# Patient Record
Sex: Female | Born: 1955 | Race: White | Hispanic: No | Marital: Married | Smoking: Never smoker
Health system: Southern US, Community
[De-identification: ages and names within clinical notes are randomized; demographics above are authoritative.]

## PROBLEM LIST (undated history)

## (undated) DIAGNOSIS — N6310 Unspecified lump in the right breast, unspecified quadrant: Secondary | ICD-10-CM

## (undated) DIAGNOSIS — F419 Anxiety disorder, unspecified: Secondary | ICD-10-CM

## (undated) HISTORY — PX: WISDOM TOOTH EXTRACTION: SHX21

---

## 1997-12-20 ENCOUNTER — Other Ambulatory Visit: Admission: RE | Admit: 1997-12-20 | Discharge: 1997-12-20 | Payer: Self-pay | Admitting: Obstetrics & Gynecology

## 1998-03-21 ENCOUNTER — Other Ambulatory Visit: Admission: RE | Admit: 1998-03-21 | Discharge: 1998-03-21 | Payer: Self-pay | Admitting: Obstetrics & Gynecology

## 1998-05-07 ENCOUNTER — Other Ambulatory Visit: Admission: RE | Admit: 1998-05-07 | Discharge: 1998-05-07 | Payer: Self-pay | Admitting: Obstetrics & Gynecology

## 1998-11-18 ENCOUNTER — Other Ambulatory Visit: Admission: RE | Admit: 1998-11-18 | Discharge: 1998-11-18 | Payer: Self-pay | Admitting: Obstetrics & Gynecology

## 1999-03-24 ENCOUNTER — Other Ambulatory Visit: Admission: RE | Admit: 1999-03-24 | Discharge: 1999-03-24 | Payer: Self-pay | Admitting: Obstetrics & Gynecology

## 1999-11-24 ENCOUNTER — Other Ambulatory Visit: Admission: RE | Admit: 1999-11-24 | Discharge: 1999-11-24 | Payer: Self-pay | Admitting: Obstetrics & Gynecology

## 2000-02-16 ENCOUNTER — Other Ambulatory Visit: Admission: RE | Admit: 2000-02-16 | Discharge: 2000-02-16 | Payer: Self-pay | Admitting: Obstetrics & Gynecology

## 2000-02-16 ENCOUNTER — Encounter (INDEPENDENT_AMBULATORY_CARE_PROVIDER_SITE_OTHER): Payer: Self-pay | Admitting: Specialist

## 2000-05-03 ENCOUNTER — Other Ambulatory Visit: Admission: RE | Admit: 2000-05-03 | Discharge: 2000-05-03 | Payer: Self-pay | Admitting: Obstetrics & Gynecology

## 2002-03-16 ENCOUNTER — Other Ambulatory Visit: Admission: RE | Admit: 2002-03-16 | Discharge: 2002-03-16 | Payer: Self-pay | Admitting: Obstetrics & Gynecology

## 2003-01-11 ENCOUNTER — Other Ambulatory Visit: Admission: RE | Admit: 2003-01-11 | Discharge: 2003-01-11 | Payer: Self-pay | Admitting: Obstetrics & Gynecology

## 2003-04-03 ENCOUNTER — Other Ambulatory Visit: Admission: RE | Admit: 2003-04-03 | Discharge: 2003-04-03 | Payer: Self-pay | Admitting: Obstetrics & Gynecology

## 2005-11-05 ENCOUNTER — Encounter: Admission: RE | Admit: 2005-11-05 | Discharge: 2005-11-05 | Payer: Self-pay | Admitting: Obstetrics & Gynecology

## 2008-06-14 ENCOUNTER — Encounter: Admission: RE | Admit: 2008-06-14 | Discharge: 2008-06-14 | Payer: Self-pay | Admitting: Obstetrics & Gynecology

## 2009-11-07 ENCOUNTER — Encounter: Admission: RE | Admit: 2009-11-07 | Discharge: 2009-11-07 | Payer: Self-pay | Admitting: Obstetrics & Gynecology

## 2009-11-14 ENCOUNTER — Encounter: Admission: RE | Admit: 2009-11-14 | Discharge: 2009-11-14 | Payer: Self-pay | Admitting: Obstetrics & Gynecology

## 2009-11-17 HISTORY — PX: BREAST BIOPSY: SHX20

## 2010-06-21 ENCOUNTER — Encounter: Payer: Self-pay | Admitting: Obstetrics & Gynecology

## 2014-03-25 ENCOUNTER — Other Ambulatory Visit: Payer: Self-pay | Admitting: Obstetrics & Gynecology

## 2014-03-25 DIAGNOSIS — R928 Other abnormal and inconclusive findings on diagnostic imaging of breast: Secondary | ICD-10-CM

## 2014-04-05 ENCOUNTER — Ambulatory Visit
Admission: RE | Admit: 2014-04-05 | Discharge: 2014-04-05 | Disposition: A | Discharge status: Home / Self Care | Payer: No Typology Code available for payment source | Source: Ambulatory Visit | Attending: Obstetrics & Gynecology | Admitting: Obstetrics & Gynecology

## 2014-04-05 ENCOUNTER — Encounter (INDEPENDENT_AMBULATORY_CARE_PROVIDER_SITE_OTHER): Payer: Self-pay

## 2014-04-05 DIAGNOSIS — R928 Other abnormal and inconclusive findings on diagnostic imaging of breast: Secondary | ICD-10-CM

## 2015-04-16 ENCOUNTER — Other Ambulatory Visit: Payer: Self-pay | Admitting: Obstetrics & Gynecology

## 2015-04-16 DIAGNOSIS — R928 Other abnormal and inconclusive findings on diagnostic imaging of breast: Secondary | ICD-10-CM

## 2015-06-13 ENCOUNTER — Ambulatory Visit
Admission: RE | Admit: 2015-06-13 | Discharge: 2015-06-13 | Disposition: A | Discharge status: Home / Self Care | Payer: BLUE CROSS/BLUE SHIELD | Source: Ambulatory Visit | Attending: Obstetrics & Gynecology | Admitting: Obstetrics & Gynecology

## 2015-06-13 DIAGNOSIS — R928 Other abnormal and inconclusive findings on diagnostic imaging of breast: Secondary | ICD-10-CM

## 2016-02-06 ENCOUNTER — Other Ambulatory Visit: Payer: Self-pay | Admitting: Obstetrics & Gynecology

## 2016-02-06 DIAGNOSIS — N63 Unspecified lump in unspecified breast: Secondary | ICD-10-CM

## 2016-03-17 ENCOUNTER — Ambulatory Visit
Admission: RE | Admit: 2016-03-17 | Discharge: 2016-03-17 | Disposition: A | Discharge status: Home / Self Care | Payer: BLUE CROSS/BLUE SHIELD | Source: Ambulatory Visit | Attending: Obstetrics & Gynecology | Admitting: Obstetrics & Gynecology

## 2016-03-17 ENCOUNTER — Other Ambulatory Visit: Payer: Self-pay | Admitting: Obstetrics & Gynecology

## 2016-03-17 DIAGNOSIS — N63 Unspecified lump in unspecified breast: Secondary | ICD-10-CM

## 2016-05-25 ENCOUNTER — Ambulatory Visit
Admission: RE | Admit: 2016-05-25 | Discharge: 2016-05-25 | Disposition: A | Discharge status: Home / Self Care | Payer: BLUE CROSS/BLUE SHIELD | Source: Ambulatory Visit | Attending: Obstetrics & Gynecology | Admitting: Obstetrics & Gynecology

## 2016-12-10 ENCOUNTER — Other Ambulatory Visit: Payer: Self-pay | Admitting: Obstetrics & Gynecology

## 2016-12-10 DIAGNOSIS — N6001 Solitary cyst of right breast: Secondary | ICD-10-CM

## 2016-12-24 ENCOUNTER — Ambulatory Visit
Admission: RE | Admit: 2016-12-24 | Discharge: 2016-12-24 | Disposition: A | Discharge status: Home / Self Care | Payer: BLUE CROSS/BLUE SHIELD | Source: Ambulatory Visit | Attending: Obstetrics & Gynecology | Admitting: Obstetrics & Gynecology

## 2016-12-24 ENCOUNTER — Other Ambulatory Visit: Payer: Self-pay | Admitting: Obstetrics & Gynecology

## 2016-12-24 DIAGNOSIS — N6001 Solitary cyst of right breast: Secondary | ICD-10-CM

## 2016-12-24 DIAGNOSIS — N6489 Other specified disorders of breast: Secondary | ICD-10-CM

## 2017-01-07 ENCOUNTER — Other Ambulatory Visit: Payer: Self-pay | Admitting: Obstetrics & Gynecology

## 2017-01-07 ENCOUNTER — Ambulatory Visit
Admission: RE | Admit: 2017-01-07 | Discharge: 2017-01-07 | Disposition: A | Discharge status: Home / Self Care | Payer: BLUE CROSS/BLUE SHIELD | Source: Ambulatory Visit | Attending: Obstetrics & Gynecology | Admitting: Obstetrics & Gynecology

## 2017-01-07 DIAGNOSIS — N6489 Other specified disorders of breast: Secondary | ICD-10-CM

## 2017-02-18 ENCOUNTER — Other Ambulatory Visit: Payer: Self-pay | Admitting: Surgery

## 2017-02-18 ENCOUNTER — Ambulatory Visit: Payer: Self-pay | Admitting: Surgery

## 2017-02-18 DIAGNOSIS — N6310 Unspecified lump in the right breast, unspecified quadrant: Secondary | ICD-10-CM

## 2017-02-18 NOTE — H&P (Signed)
Ebony Bartlett 02/18/2017 10:09 AM Location: [facility] Patient #: [id number] DOB: 1956-05-03 Undefined / Language: Ebony Bartlett / Race: White Female  History of Present Illness Ebony Fus A. Anjel Perfetti MD; 02/18/2017 11:10 AM) Patient words: Patient sent at the request of Dr. Larita Fife for right breast abnormal mammogram. The patient was found to have a mammographic abnormality. [place] core biopsy which showed a complex sclerosing lesion of right breast mass. Patient denies any history of significant breast pain, nipple discharge or mass. Her mother had breast cancer but there were no other first-degree relatives with breast cancer.               Diagnosis Breast, right, needle core biopsy, medial lower - COMPLEX SCLEROSING LESION. 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 - SEE MICROSCOPIC DESCRIPTION. Microscopic Comment There is a small complex sclerosing lesion with extensive fibrocystic changes. Called to [facility] on 01/10/17. (JDP:gt, 01/10/17) [name] MD Pathologist, Electronic Signature (Case signed 01/10/2017) Specimen Gross and Clinical Information Specimen Comment Arch distortion Specimen(s) Obtained: Breast, right, needle core biopsy, medial lower Specimen Clinical Information R/O cancer Gross Received in formalin labeled "Ebony Bartlett, right                 CLINICAL DATA: Six-month follow-up for probably benign right breast masses.  EXAM: 2D DIGITAL DIAGNOSTIC RIGHT MAMMOGRAM WITH CAD AND ADJUNCT TOMO  ULTRASOUND RIGHT BREAST  COMPARISON: 05/25/2016 and earlier  ACR Breast Density Category c: The breast tissue is heterogeneously dense, which may obscure small masses.  FINDINGS: A circumscribed mass is identified in the retroareolar region of the right breast, further evaluated sonographically. An area of possible distortion is identified in the slightly medial  aspect of the right breast, posterior depth.  Mammographic images were processed with CAD.  On physical exam, I palpate no abnormality in the central portion of the right breast.  Targeted ultrasound is performed, showing adjacent simple cysts in the 12 o'clock location of the right breast 1 cm from the nipple which measure 1.3 x 1.1 x 0.6 cm and 0.6 x 0.5 x 0.5 cm. A sonographic correlate for the area of distortion is not definitively identified. Evaluation of the axilla is negative for adenopathy.  IMPRESSION: 1. Simple cysts within the central portion of the right breast. 2. Area of persistent distortion in the slightly medial posterior aspect of the right breast for which tissue diagnosis is indicated. No sonographic correlate is identified for this finding. 3. No adenopathy by ultrasound.  RECOMMENDATION: Stereotactic guided core biopsy is recommended for area of distortion medial portion of the right breast.  I have discussed the findings and recommendations with the patient. Results were also provided in writing at the conclusion of the visit. If applicable, a reminder letter will be sent to the patient regarding the next appointment.  BI-RADS CATEGORY 4: Suspicious.   Electronically Signed By: Norva Pavlov M.Bartlett. On: 12/24/2016 10:52.  The patient is a 61 year old female.   Past Surgical History (Odean Mcelwain A. Nyles Mitton, MD; 02/18/2017 10:37 AM) Colon Polyp Removal - Colonoscopy Oral Surgery  Diagnostic Studies History (Ebony Bartlett A. Ebony Cartaya, MD; 02/18/2017 10:37 AM) Colonoscopy 1-5 years ago Mammogram within last year Pap Smear 1-5 years ago  Allergies Ebony Bartlett, RMA; 02/18/2017 10:10 AM) No Known Allergies 02/18/2017  Medication History Ebony Bartlett, RMA; 02/18/2017 10:11 AM) Ebony Bartlett (0.075MG /04VW Patch TW, Transdermal) Active. Progesterone (  Capsule, Oral) Active. ALPRAZolam (0.5MG  Tablet, Oral) Active. Medications  Reconciled  Social History (  [name], MD; 02/18/2017 10:37 AM) Caffeine use Coffee, Tea. No alcohol use No drug use Tobacco use Never smoker.  Family History (Ebony Bartlett A. Ebony Bircher, MD; 02/18/2017 10:37 AM) Breast Cancer Mother. Colon Polyps Mother. Hypertension Father, Mother.  Pregnancy / Birth History Ebony Fus A. Ebony Suchy, MD; 02/18/2017 10:37 AM) Age at menarche 14 years. Age of menopause 51-55 Contraceptive History Oral contraceptives. Gravida 3 Irregular periods Length (months) of breastfeeding 7-12 Maternal age 33-25 Para 2  Other Problems ([name] A. Tyshauna Finkbiner, MD; 02/18/2017 10:37 AM) No pertinent past medical history     Review of Systems (Ebony Bartlett A. Ebony Weinel MD; 02/18/2017 10:37 AM) General Not Present- Appetite Loss, Chills, Fatigue, Fever, Night Sweats, Weight Gain and Weight Loss. Skin Not Present- Change in Wart/Mole, Dryness, Hives, Jaundice, New Lesions, Non-Healing Wounds, Rash and Ulcer. HEENT Present- Wears glasses/contact lenses. Not Present- Earache, Hearing Loss, Hoarseness, Nose Bleed, Oral Ulcers, Ringing in the Ears, Seasonal Allergies, Sinus Pain, Sore Throat, Visual Disturbances and Yellow Eyes. Respiratory Not Present- Bloody sputum, Chronic Cough, Difficulty Breathing, Snoring and Wheezing. Breast Not Present- Breast Mass, Breast Pain, Nipple Discharge and Skin Changes. Cardiovascular Not Present- Chest Pain, Difficulty Breathing Lying Down, Leg Cramps, Palpitations, Rapid Heart Rate, Shortness of Breath and Swelling of Extremities. Gastrointestinal Not Present- Abdominal Pain, Bloating, Bloody Stool, Change in Bowel Habits, Chronic diarrhea, Constipation, Difficulty Swallowing, Excessive gas, Gets full quickly at meals, Hemorrhoids, Indigestion, Nausea, Rectal Pain and Vomiting. Female Genitourinary Not Present- Frequency, Nocturia, Painful Urination, Pelvic Pain and Urgency. Musculoskeletal Not Present- Back Pain, Joint Pain, Joint  Stiffness, Muscle Pain, Muscle Weakness and Swelling of Extremities. Neurological Not Present- Decreased Memory, Fainting, Headaches, Numbness, Seizures, Tingling, Tremor, Trouble walking and Weakness. Psychiatric Present- Change in Sleep Pattern. Not Present- Anxiety, Bipolar, Depression, Fearful and Frequent crying. Endocrine Present- Hot flashes. Not Present- Cold Intolerance, Excessive Hunger, Hair Changes, Heat Intolerance and New Diabetes. Hematology Not Present- Blood Thinners, Easy Bruising, Excessive bleeding, Gland problems, HIV and Persistent Infections.  Vitals Ebony Bartlett RMA; 02/18/2017 10:11 AM) 02/18/2017 10:11 AM Weight: 182.8 lb Height: 66in Body Surface Area: 1.93 m Body Mass Index: 29.5 kg/m  Temp.: 97.22F  Pulse: 72 (Regular)  BP: 150/100 (Sitting, Left Arm, Standard)      Physical Exam (Maziah Smola A. Normon Pettijohn MD; 02/18/2017 11:10 AM)  General Mental Status-Alert. General Appearance-Consistent with stated age. Hydration-Well hydrated. Voice-Normal.  Head and Neck Head-normocephalic, atraumatic with no lesions or palpable masses. Trachea-midline. Thyroid Gland Characteristics - normal size and consistency.  Chest and Lung Exam Chest and lung exam reveals -quiet, even and easy respiratory effort with no use of accessory muscles and on auscultation, normal breath sounds, no adventitious sounds and normal vocal resonance. Inspection Chest Wall - Normal. Back - normal.  Breast Breast - Left-Symmetric, Non Tender, No Biopsy scars, no Dimpling, No Inflammation, No Lumpectomy scars, No Mastectomy scars, No Peau Bartlett' Orange. Breast - Right-Symmetric, Non Tender, No Biopsy scars, no Dimpling, No Inflammation, No Lumpectomy scars, No Mastectomy scars, No Peau Bartlett' Orange. Breast Lump-No Palpable Breast Mass.  Cardiovascular Cardiovascular examination reveals -normal heart sounds, regular rate and rhythm with no murmurs and normal pedal  pulses bilaterally.  Lymphatic Head & Neck  General Head & Neck Lymphatics: Bilateral - Description - Normal. Axillary  General Axillary Region: Bilateral - Description - Normal. Tenderness - Non Tender.    Assessment & Plan (Davis Ambrosini A. Audris Speaker MD; 02/18/2017 11:10 AM)  BREAST MASS, RIGHT (N63.10) Impression: Complex sclerosing lesion.  Risk pregnancy is 3% or less. Discussed  rationale for excision versus observation. Risks, benefits and alternatives to surgery discussed with the patient and husband today. Risk of lumpectomy include bleeding, infection, seroma, more surgery, use of seed/wire, wound care, cosmetic deformity and the need for other treatments, death , blood clots, death. Pt agrees to proceed.  Current Plans Pt Education - [facility] Breast Biopsy HCI: discussed with patient and provided information. You are being scheduled for surgery- Our schedulers will call you.  You should hear from our office's scheduling department within 5 working days about the location, date, and time of surgery. We try to make accommodations for patient's preferences in scheduling surgery, but sometimes the OR schedule or the surgeon's schedule prevents [place] from making those accommodations.  If you have not heard from our office [phone]) in 5 working days, call the office and ask for your surgeon's nurse.  If you have other questions about your diagnosis, plan, or surgery, call the office and ask for your surgeon's nurse.

## 2017-02-23 ENCOUNTER — Encounter (HOSPITAL_BASED_OUTPATIENT_CLINIC_OR_DEPARTMENT_OTHER): Payer: Self-pay | Admitting: *Deleted

## 2017-02-25 ENCOUNTER — Encounter (HOSPITAL_BASED_OUTPATIENT_CLINIC_OR_DEPARTMENT_OTHER)
Admission: RE | Admit: 2017-02-25 | Discharge: 2017-02-25 | Disposition: A | Discharge status: Home / Self Care | Payer: BLUE CROSS/BLUE SHIELD | Source: Ambulatory Visit | Attending: Surgery | Admitting: Surgery

## 2017-02-25 DIAGNOSIS — F419 Anxiety disorder, unspecified: Secondary | ICD-10-CM | POA: Diagnosis not present

## 2017-02-25 DIAGNOSIS — Z803 Family history of malignant neoplasm of breast: Secondary | ICD-10-CM | POA: Diagnosis not present

## 2017-02-25 DIAGNOSIS — N6310 Unspecified lump in the right breast, unspecified quadrant: Secondary | ICD-10-CM | POA: Diagnosis present

## 2017-02-25 DIAGNOSIS — N6001 Solitary cyst of right breast: Secondary | ICD-10-CM | POA: Diagnosis not present

## 2017-02-25 DIAGNOSIS — N6021 Fibroadenosis of right breast: Secondary | ICD-10-CM | POA: Diagnosis not present

## 2017-02-25 DIAGNOSIS — N6091 Unspecified benign mammary dysplasia of right breast: Secondary | ICD-10-CM | POA: Diagnosis not present

## 2017-02-25 DIAGNOSIS — N6011 Diffuse cystic mastopathy of right breast: Secondary | ICD-10-CM | POA: Diagnosis not present

## 2017-02-25 LAB — COMPREHENSIVE METABOLIC PANEL
ALK PHOS: 53 U/L (ref 38–126)
ALT: 15 U/L (ref 14–54)
ANION GAP: 5 (ref 5–15)
AST: 17 U/L (ref 15–41)
Albumin: 4.1 g/dL (ref 3.5–5.0)
BUN: 12 mg/dL (ref 6–20)
CALCIUM: 9 mg/dL (ref 8.9–10.3)
CO2: 27 mmol/L (ref 22–32)
Chloride: 106 mmol/L (ref 101–111)
Creatinine, Ser: 0.81 mg/dL (ref 0.44–1.00)
GLUCOSE: 89 mg/dL (ref 65–99)
Potassium: 4.7 mmol/L (ref 3.5–5.1)
SODIUM: 138 mmol/L (ref 135–145)
TOTAL PROTEIN: 7.2 g/dL (ref 6.5–8.1)
Total Bilirubin: 0.6 mg/dL (ref 0.3–1.2)

## 2017-02-25 LAB — CBC WITH DIFFERENTIAL/PLATELET
Basophils Absolute: 0 10*3/uL (ref 0.0–0.1)
Basophils Relative: 0 %
EOS ABS: 0.1 10*3/uL (ref 0.0–0.7)
EOS PCT: 2 %
HCT: 40 % (ref 36.0–46.0)
Hemoglobin: 12.9 g/dL (ref 12.0–15.0)
LYMPHS ABS: 1.3 10*3/uL (ref 0.7–4.0)
Lymphocytes Relative: 18 %
MCH: 28.8 pg (ref 26.0–34.0)
MCHC: 32.3 g/dL (ref 30.0–36.0)
MCV: 89.3 fL (ref 78.0–100.0)
MONOS PCT: 8 %
Monocytes Absolute: 0.6 10*3/uL (ref 0.1–1.0)
Neutro Abs: 5.2 10*3/uL (ref 1.7–7.7)
Neutrophils Relative %: 72 %
PLATELETS: 232 10*3/uL (ref 150–400)
RBC: 4.48 MIL/uL (ref 3.87–5.11)
RDW: 13.2 % (ref 11.5–15.5)
WBC: 7.2 10*3/uL (ref 4.0–10.5)

## 2017-02-25 NOTE — Progress Notes (Signed)
Ensure pre surgery drink given with instructions to complete by 0630 dos, pt verbalized understanding. 

## 2017-03-02 ENCOUNTER — Ambulatory Visit
Admission: RE | Admit: 2017-03-02 | Discharge: 2017-03-02 | Disposition: A | Discharge status: Home / Self Care | Payer: BLUE CROSS/BLUE SHIELD | Source: Ambulatory Visit | Attending: Surgery | Admitting: Surgery

## 2017-03-02 DIAGNOSIS — N6310 Unspecified lump in the right breast, unspecified quadrant: Secondary | ICD-10-CM

## 2017-03-03 ENCOUNTER — Ambulatory Visit (HOSPITAL_BASED_OUTPATIENT_CLINIC_OR_DEPARTMENT_OTHER): Payer: BLUE CROSS/BLUE SHIELD | Admitting: Anesthesiology

## 2017-03-03 ENCOUNTER — Ambulatory Visit
Admission: RE | Admit: 2017-03-03 | Discharge: 2017-03-03 | Disposition: A | Discharge status: Home / Self Care | Payer: BLUE CROSS/BLUE SHIELD | Source: Ambulatory Visit | Attending: Surgery | Admitting: Surgery

## 2017-03-03 ENCOUNTER — Ambulatory Visit (HOSPITAL_BASED_OUTPATIENT_CLINIC_OR_DEPARTMENT_OTHER)
Admission: RE | Admit: 2017-03-03 | Discharge: 2017-03-03 | Disposition: A | Discharge status: Home / Self Care | Payer: BLUE CROSS/BLUE SHIELD | Source: Ambulatory Visit | Attending: Surgery | Admitting: Surgery

## 2017-03-03 ENCOUNTER — Encounter (HOSPITAL_BASED_OUTPATIENT_CLINIC_OR_DEPARTMENT_OTHER)
Admission: RE | Disposition: A | Discharge status: Home / Self Care | Payer: Self-pay | Source: Ambulatory Visit | Attending: Surgery

## 2017-03-03 ENCOUNTER — Encounter (HOSPITAL_BASED_OUTPATIENT_CLINIC_OR_DEPARTMENT_OTHER): Payer: Self-pay | Admitting: Anesthesiology

## 2017-03-03 DIAGNOSIS — N6011 Diffuse cystic mastopathy of right breast: Secondary | ICD-10-CM | POA: Diagnosis not present

## 2017-03-03 DIAGNOSIS — N6310 Unspecified lump in the right breast, unspecified quadrant: Secondary | ICD-10-CM

## 2017-03-03 DIAGNOSIS — N6001 Solitary cyst of right breast: Secondary | ICD-10-CM | POA: Insufficient documentation

## 2017-03-03 DIAGNOSIS — N6021 Fibroadenosis of right breast: Secondary | ICD-10-CM | POA: Insufficient documentation

## 2017-03-03 DIAGNOSIS — F419 Anxiety disorder, unspecified: Secondary | ICD-10-CM | POA: Insufficient documentation

## 2017-03-03 DIAGNOSIS — Z803 Family history of malignant neoplasm of breast: Secondary | ICD-10-CM | POA: Insufficient documentation

## 2017-03-03 DIAGNOSIS — N6091 Unspecified benign mammary dysplasia of right breast: Secondary | ICD-10-CM | POA: Insufficient documentation

## 2017-03-03 HISTORY — DX: Unspecified lump in the right breast, unspecified quadrant: N63.10

## 2017-03-03 HISTORY — DX: Anxiety disorder, unspecified: F41.9

## 2017-03-03 HISTORY — PX: BREAST LUMPECTOMY: SHX2

## 2017-03-03 HISTORY — PX: BREAST LUMPECTOMY WITH RADIOACTIVE SEED LOCALIZATION: SHX6424

## 2017-03-03 SURGERY — BREAST LUMPECTOMY WITH RADIOACTIVE SEED LOCALIZATION
Anesthesia: General | Site: Breast | Laterality: Right

## 2017-03-03 MED ORDER — FENTANYL CITRATE (PF) 100 MCG/2ML IJ SOLN: 25.0000 ug | INTRAMUSCULAR | Status: DC | PRN

## 2017-03-03 MED ORDER — FENTANYL CITRATE (PF) 100 MCG/2ML IJ SOLN
INTRAMUSCULAR | Status: AC
  Filled 2017-03-03: qty 2

## 2017-03-03 MED ORDER — DEXAMETHASONE SODIUM PHOSPHATE 4 MG/ML IJ SOLN
INTRAMUSCULAR | Status: DC | PRN
  Administered 2017-03-03: 10 mg via INTRAVENOUS

## 2017-03-03 MED ORDER — LIDOCAINE 2% (20 MG/ML) 5 ML SYRINGE
INTRAMUSCULAR | Status: DC | PRN
  Administered 2017-03-03: 60 mg via INTRAVENOUS

## 2017-03-03 MED ORDER — DEXAMETHASONE SODIUM PHOSPHATE 10 MG/ML IJ SOLN
INTRAMUSCULAR | Status: AC
  Filled 2017-03-03: qty 1

## 2017-03-03 MED ORDER — ACETAMINOPHEN 500 MG PO TABS
ORAL_TABLET | ORAL | Status: AC
Start: 2017-03-03 — End: 2017-03-03
  Filled 2017-03-03: qty 2

## 2017-03-03 MED ORDER — GABAPENTIN 300 MG PO CAPS
300.0000 mg | ORAL_CAPSULE | ORAL | Status: AC
  Administered 2017-03-03: 300 mg via ORAL

## 2017-03-03 MED ORDER — ONDANSETRON HCL 4 MG/2ML IJ SOLN
INTRAMUSCULAR | Status: DC | PRN
  Administered 2017-03-03: 4 mg via INTRAVENOUS

## 2017-03-03 MED ORDER — FENTANYL CITRATE (PF) 100 MCG/2ML IJ SOLN
50.0000 ug | INTRAMUSCULAR | Status: DC | PRN
  Administered 2017-03-03: 100 ug via INTRAVENOUS

## 2017-03-03 MED ORDER — ONDANSETRON HCL 4 MG/2ML IJ SOLN
INTRAMUSCULAR | Status: AC
  Filled 2017-03-03: qty 2

## 2017-03-03 MED ORDER — BUPIVACAINE-EPINEPHRINE (PF) 0.25% -1:200000 IJ SOLN
INTRAMUSCULAR | Status: DC | PRN
  Administered 2017-03-03: 10 mL

## 2017-03-03 MED ORDER — LACTATED RINGERS IV SOLN
INTRAVENOUS | Status: DC
  Administered 2017-03-03 (×2): via INTRAVENOUS

## 2017-03-03 MED ORDER — CHLORHEXIDINE GLUCONATE CLOTH 2 % EX PADS: 6.0000 | MEDICATED_PAD | Freq: Once | CUTANEOUS | Status: DC

## 2017-03-03 MED ORDER — HYDROCODONE-ACETAMINOPHEN 5-325 MG PO TABS
1.0000 | ORAL_TABLET | Freq: Four times a day (QID) | ORAL | 0 refills | Status: AC | PRN
Start: 2017-03-03 — End: ?

## 2017-03-03 MED ORDER — PROPOFOL 10 MG/ML IV BOLUS
INTRAVENOUS | Status: AC
  Filled 2017-03-03: qty 20

## 2017-03-03 MED ORDER — EPHEDRINE SULFATE 50 MG/ML IJ SOLN
INTRAMUSCULAR | Status: DC | PRN
  Administered 2017-03-03 (×2): 10 mg via INTRAVENOUS

## 2017-03-03 MED ORDER — ACETAMINOPHEN 500 MG PO TABS
1000.0000 mg | ORAL_TABLET | ORAL | Status: AC
Start: 2017-03-03 — End: 2017-03-03
  Administered 2017-03-03: 1000 mg via ORAL

## 2017-03-03 MED ORDER — GABAPENTIN 300 MG PO CAPS
ORAL_CAPSULE | ORAL | Status: AC
  Filled 2017-03-03: qty 1

## 2017-03-03 MED ORDER — MIDAZOLAM HCL 2 MG/2ML IJ SOLN
INTRAMUSCULAR | Status: AC
  Filled 2017-03-03: qty 2

## 2017-03-03 MED ORDER — PROPOFOL 10 MG/ML IV BOLUS
INTRAVENOUS | Status: DC | PRN
  Administered 2017-03-03: 150 mg via INTRAVENOUS

## 2017-03-03 MED ORDER — PROMETHAZINE HCL 25 MG/ML IJ SOLN
6.2500 mg | INTRAMUSCULAR | Status: DC | PRN
Start: 2017-03-03 — End: 2017-03-03

## 2017-03-03 MED ORDER — PHENYLEPHRINE HCL 10 MG/ML IJ SOLN
INTRAMUSCULAR | Status: DC | PRN
  Administered 2017-03-03 (×2): 80 ug via INTRAVENOUS

## 2017-03-03 MED ORDER — DEXTROSE 5 % IV SOLN: 3.0000 g | INTRAVENOUS | Status: DC

## 2017-03-03 MED ORDER — MIDAZOLAM HCL 2 MG/2ML IJ SOLN
1.0000 mg | INTRAMUSCULAR | Status: DC | PRN
  Administered 2017-03-03: 2 mg via INTRAVENOUS

## 2017-03-03 MED ORDER — CEFAZOLIN SODIUM-DEXTROSE 2-4 GM/100ML-% IV SOLN
2.0000 g | INTRAVENOUS | Status: AC
Start: 2017-03-03 — End: 2017-03-03
  Administered 2017-03-03: 2 g via INTRAVENOUS

## 2017-03-03 MED ORDER — SCOPOLAMINE 1 MG/3DAYS TD PT72: 1.0000 | MEDICATED_PATCH | Freq: Once | TRANSDERMAL | Status: DC | PRN

## 2017-03-03 MED ORDER — CEFAZOLIN SODIUM-DEXTROSE 2-4 GM/100ML-% IV SOLN
INTRAVENOUS | Status: AC
  Filled 2017-03-03: qty 100

## 2017-03-03 MED ORDER — LIDOCAINE 2% (20 MG/ML) 5 ML SYRINGE
INTRAMUSCULAR | Status: AC
  Filled 2017-03-03: qty 5

## 2017-03-03 SURGICAL SUPPLY — 54 items
ADH SKN CLS APL DERMABOND .7 (GAUZE/BANDAGES/DRESSINGS) ×1
APPLIER CLIP 9.375 MED OPEN (MISCELLANEOUS)
APR CLP MED 9.3 20 MLT OPN (MISCELLANEOUS)
BINDER BREAST LRG (GAUZE/BANDAGES/DRESSINGS) IMPLANT
BINDER BREAST MEDIUM (GAUZE/BANDAGES/DRESSINGS) IMPLANT
BINDER BREAST XLRG (GAUZE/BANDAGES/DRESSINGS) ×2 IMPLANT
BINDER BREAST XXLRG (GAUZE/BANDAGES/DRESSINGS) IMPLANT
BLADE SURG 15 STRL LF DISP TIS (BLADE) ×1 IMPLANT
BLADE SURG 15 STRL SS (BLADE) ×3
CANISTER SUC SOCK COL 7IN (MISCELLANEOUS) IMPLANT
CANISTER SUCT 1200ML W/VALVE (MISCELLANEOUS) ×2 IMPLANT
CHLORAPREP W/TINT 26ML (MISCELLANEOUS) ×3 IMPLANT
CLIP APPLIE 9.375 MED OPEN (MISCELLANEOUS) IMPLANT
COVER BACK TABLE 60X90IN (DRAPES) ×3 IMPLANT
COVER MAYO STAND STRL (DRAPES) ×3 IMPLANT
COVER PROBE W GEL 5X96 (DRAPES) ×3 IMPLANT
DECANTER SPIKE VIAL GLASS SM (MISCELLANEOUS) IMPLANT
DERMABOND ADVANCED (GAUZE/BANDAGES/DRESSINGS) ×2
DERMABOND ADVANCED .7 DNX12 (GAUZE/BANDAGES/DRESSINGS) ×1 IMPLANT
DEVICE DUBIN W/COMP PLATE 8390 (MISCELLANEOUS) ×3 IMPLANT
DRAPE LAPAROSCOPIC ABDOMINAL (DRAPES) IMPLANT
DRAPE LAPAROTOMY 100X72 PEDS (DRAPES) ×3 IMPLANT
DRAPE UTILITY XL STRL (DRAPES) ×3 IMPLANT
ELECT COATED BLADE 2.86 ST (ELECTRODE) ×3 IMPLANT
ELECT REM PT RETURN 9FT ADLT (ELECTROSURGICAL) ×3
ELECTRODE REM PT RTRN 9FT ADLT (ELECTROSURGICAL) ×1 IMPLANT
GLOVE BIOGEL PI IND STRL 7.0 (GLOVE) IMPLANT
GLOVE BIOGEL PI IND STRL 8 (GLOVE) ×1 IMPLANT
GLOVE BIOGEL PI INDICATOR 7.0 (GLOVE) ×2
GLOVE BIOGEL PI INDICATOR 8 (GLOVE) ×2
GLOVE ECLIPSE 6.5 STRL STRAW (GLOVE) ×2 IMPLANT
GLOVE ECLIPSE 8.0 STRL XLNG CF (GLOVE) ×3 IMPLANT
GLOVE EXAM NITRILE MD LF STRL (GLOVE) ×2 IMPLANT
GOWN STRL REUS W/ TWL LRG LVL3 (GOWN DISPOSABLE) ×2 IMPLANT
GOWN STRL REUS W/TWL LRG LVL3 (GOWN DISPOSABLE) ×6
HEMOSTAT ARISTA ABSORB 3G PWDR (MISCELLANEOUS) IMPLANT
HEMOSTAT SNOW SURGICEL 2X4 (HEMOSTASIS) IMPLANT
KIT MARKER MARGIN INK (KITS) ×3 IMPLANT
NDL HYPO 25X1 1.5 SAFETY (NEEDLE) ×1 IMPLANT
NEEDLE HYPO 25X1 1.5 SAFETY (NEEDLE) ×3 IMPLANT
NS IRRIG 1000ML POUR BTL (IV SOLUTION) ×3 IMPLANT
PACK BASIN DAY SURGERY FS (CUSTOM PROCEDURE TRAY) ×3 IMPLANT
PENCIL BUTTON HOLSTER BLD 10FT (ELECTRODE) ×3 IMPLANT
SLEEVE SCD COMPRESS KNEE MED (MISCELLANEOUS) ×3 IMPLANT
SPONGE LAP 4X18 X RAY DECT (DISPOSABLE) ×5 IMPLANT
SUT MNCRL AB 4-0 PS2 18 (SUTURE) ×3 IMPLANT
SUT SILK 2 0 SH (SUTURE) IMPLANT
SUT VICRYL 3-0 CR8 SH (SUTURE) ×3 IMPLANT
SYR CONTROL 10ML LL (SYRINGE) ×3 IMPLANT
TOWEL OR 17X24 6PK STRL BLUE (TOWEL DISPOSABLE) ×3 IMPLANT
TOWEL OR NON WOVEN STRL DISP B (DISPOSABLE) ×1 IMPLANT
TUBE CONNECTING 20'X1/4 (TUBING) ×1
TUBE CONNECTING 20X1/4 (TUBING) ×1 IMPLANT
YANKAUER SUCT BULB TIP NO VENT (SUCTIONS) ×2 IMPLANT

## 2017-03-03 NOTE — Interval H&P Note (Signed)
History and Physical Interval Note:  03/03/2017 10:35 AM  Ebony Bartlett  has presented today for surgery, with the diagnosis of RIGHT BREAST MASS  The various methods of treatment have been discussed with the patient and family. After consideration of risks, benefits and other options for treatment, the patient has consented to  Procedure(s): RIGHT BREAST LUMPECTOMY WITH RADIOACTIVE SEED LOCALIZATION (Right) as a surgical intervention .  The patient's history has been reviewed, patient examined, no change in status, stable for surgery.  I have reviewed the patient's chart and labs.  Questions were answered to the patient's satisfaction.     Marvis Saefong A.

## 2017-03-03 NOTE — Transfer of Care (Signed)
Immediate Anesthesia Transfer of Care Note  Patient: Ebony Bartlett  Procedure(s) Performed: RIGHT BREAST LUMPECTOMY WITH RADIOACTIVE SEED LOCALIZATION (Right Breast)  Patient Location: PACU  Anesthesia Type:General  Level of Consciousness: sedated  Airway & Oxygen Therapy: Patient Spontanous Breathing and Patient connected to face mask oxygen  Post-op Assessment: Report given to RN and Post -op Vital signs reviewed and stable  Post vital signs: Reviewed and stable  Last Vitals:  Vitals:   03/03/17 0847  BP: (!) 153/73  Pulse: 70  Resp: 16  Temp: 36.9 C  SpO2: 100%    Last Pain:  Vitals:   03/03/17 0847  TempSrc: Oral      Patients Stated Pain Goal: 0 (03/03/17 0847)  Complications: No apparent anesthesia complications

## 2017-03-03 NOTE — Anesthesia Postprocedure Evaluation (Signed)
Anesthesia Post Note  Patient: Ebony Bartlett  Procedure(s) Performed: RIGHT BREAST LUMPECTOMY WITH RADIOACTIVE SEED LOCALIZATION (Right Breast)     Patient location during evaluation: PACU Anesthesia Type: General Level of consciousness: awake and alert Pain management: pain level controlled Vital Signs Assessment: post-procedure vital signs reviewed and stable Respiratory status: spontaneous breathing, nonlabored ventilation and respiratory function stable Cardiovascular status: blood pressure returned to baseline and stable Postop Assessment: no apparent nausea or vomiting Anesthetic complications: no    Last Vitals:  Vitals:   03/03/17 1225 03/03/17 1317  BP:  127/77  Pulse: 73 71  Resp: 17 16  Temp:  36.5 C  SpO2: 97% 99%    Last Pain:  Vitals:   03/03/17 1317  TempSrc: Oral  PainSc: 1                  Cecile Hearing

## 2017-03-03 NOTE — Anesthesia Preprocedure Evaluation (Signed)
Anesthesia Evaluation  Patient identified by MRN, date of birth, ID band Patient awake    Reviewed: Allergy & Precautions, NPO status , Patient's Chart, lab work & pertinent test results  Airway Mallampati: II  TM Distance: <3 FB Neck ROM: Full    Dental  (+) Teeth Intact, Dental Advisory Given   Pulmonary neg pulmonary ROS,    Pulmonary exam normal breath sounds clear to auscultation       Cardiovascular Exercise Tolerance: Good negative cardio ROS Normal cardiovascular exam Rhythm:Regular Rate:Normal     Neuro/Psych PSYCHIATRIC DISORDERS Anxiety negative neurological ROS     GI/Hepatic negative GI ROS, Neg liver ROS,   Endo/Other  negative endocrine ROS  Renal/GU negative Renal ROS     Musculoskeletal negative musculoskeletal ROS (+)   Abdominal   Peds  Hematology negative hematology ROS (+)   Anesthesia Other Findings Day of surgery medications reviewed with the patient.  Right breast cancer  Reproductive/Obstetrics                             Anesthesia Physical Anesthesia Plan  ASA: II  Anesthesia Plan: General   Post-op Pain Management:    Induction: Intravenous  PONV Risk Score and Plan: 3 and Ondansetron, Dexamethasone, Midazolam and Treatment may vary due to age or medical condition  Airway Management Planned: LMA  Additional Equipment:   Intra-op Plan:   Post-operative Plan: Extubation in OR  Informed Consent: I have reviewed the patients History and Physical, chart, labs and discussed the procedure including the risks, benefits and alternatives for the proposed anesthesia with the patient or authorized representative who has indicated his/her understanding and acceptance.   Dental advisory given  Plan Discussed with: CRNA  Anesthesia Plan Comments: (Risks/benefits of general anesthesia discussed with patient including risk of damage to teeth, lips, gum, and  tongue, nausea/vomiting, allergic reactions to medications, and the possibility of heart attack, stroke and death.  All patient questions answered.  Patient wishes to proceed.)        Anesthesia Quick Evaluation

## 2017-03-03 NOTE — Op Note (Signed)
eoperative diagnosis:Right breast complex sclerosing lesion/mass  Postoperative diagnosis: Same   Procedure: right  breast seed localized lumpectomy  Surgeon: Harriette Bouillon M.D.  Anesthesia: Gen. With 0.25% Sensorcaine local  EBL: 20 cc  Specimen: right  breast tissue with clip and radioactive seed in the specimen. Verified with neoprobe and radiographic image showing both seed and clip in specimen  Indications for procedure: The patient presents for right breast  lumpectomy after core biopsy showed CSL. Discussed the rationale for considering excision. Small risk of malignancy associated with papilloma lesion after core biopsy. Discussed observation. Discussed wire/SEED  localization. Patient desired excision of right breast CSL.The procedure has been discussed with the patient. Alternatives to surgery have been discussed with the patient.  Risks of surgery include bleeding,  Infection,  Seroma formation, death, nipple loss , cosmetic deformity   and the need for further surgery.   The patient understands and wishes to proceed.   Description of procedure: Patient underwent seed placement as an outpatient. Patient presents today for right  breast seed localized lumpectomy. Patient seen in the  holding area. Questions were answered.  Patient taken back to the operating room and placed upon the OR table. After induction of general anesthesia, right breast prepped and draped in a sterile fashion. Timeout was done to verify proper side/ procedure. Neoprobe used and hot spot identified in the central right  breast. This was marked with pen. Curvilinear incision made along superior part of NAC.  Dissection used with the help of a neoprobe around the tissue where the seed and clip were located. Tissue removed in its entirety with gross  NEGATIVE margins.. Neoprobe used and seed within specimen. Radiographs taken which show clip and seed IN THE specimen.   .Hemostasis achieved and cavity closed with 3-0  Vicryl and 4-0 Monocryl. Dermabond applied. All final counts found to be correct. Specimen transported to pathology. Patient awoke extubated taken to recovery in satisfactory condition.

## 2017-03-03 NOTE — Discharge Instructions (Signed)
[facility] °Office Phone Number [phone] ° °BREAST BIOPSY/ PARTIAL MASTECTOMY: POST OP INSTRUCTIONS ° °Always review your discharge instruction sheet given to you by the facility where your surgery was performed. ° °IF YOU HAVE DISABILITY OR FAMILY LEAVE FORMS, YOU MUST BRING THEM TO THE OFFICE FOR PROCESSING.  DO NOT GIVE THEM TO YOUR DOCTOR. ° °1. A prescription for pain medication may be given to you upon discharge.  Take your pain medication as prescribed, if needed.  If narcotic pain medicine is not needed, then you may take acetaminophen (Tylenol) or ibuprofen (Advil) as needed. °2. Take your usually prescribed medications unless otherwise directed °3. If you need a refill on your pain medication, please contact your pharmacy.  They will contact our office to request authorization.  Prescriptions will not be filled after 5pm or on week-ends. °4. You should eat very light the first 24 hours after surgery, such as soup, crackers, pudding, etc.  Resume your normal diet the day after surgery. °5. Most patients will experience some swelling and bruising in the breast.  Ice packs and a good support bra will help.  Swelling and bruising can take several days to resolve.  °6. It is common to experience some constipation if taking pain medication after surgery.  Increasing fluid intake and taking a stool softener will usually help or prevent this problem from occurring.  A mild laxative (Milk of Magnesia or Miralax) should be taken according to package directions if there are no bowel movements after 48 hours. °7. Unless discharge instructions indicate otherwise, you may remove your bandages 24-48 hours after surgery, and you may shower at that time.  You may have steri-strips (small skin tapes) in place directly over the incision.  These strips should be left on the skin for 7-10 days.  If your surgeon used skin glue on the incision, you may shower in 24 hours.  The glue will flake off over the  next 2-3 weeks.  Any sutures or staples will be removed at the office during your follow-up visit. °8. ACTIVITIES:  You may resume regular daily activities (gradually increasing) beginning the next day.  Wearing a good support bra or sports bra minimizes pain and swelling.  You may have sexual intercourse when it is comfortable. °a. You may drive when you no longer are taking prescription pain medication, you can comfortably wear a seatbelt, and you can safely maneuver your car and apply brakes. °b. RETURN TO WORK:  ______________________________________________________________________________________ °9. You should see your doctor in the office for a follow-up appointment approximately two weeks after your surgery.  Your doctor’s nurse will typically make your follow-up appointment when she calls you with your pathology report.  Expect your pathology report 2-3 business days after your surgery.  You may call to check if you do not hear from us after three days. °10. OTHER INSTRUCTIONS: _______________________________________________________________________________________________ _____________________________________________________________________________________________________________________________________ °_____________________________________________________________________________________________________________________________________ °_____________________________________________________________________________________________________________________________________ ° °WHEN TO CALL YOUR DOCTOR: °1. Fever over 101.0 °2. Nausea and/or vomiting. °3. Extreme swelling or bruising. °4. Continued bleeding from incision. °5. Increased pain, redness, or drainage from the incision. ° °The clinic staff is available to answer your questions during regular business hours.  Please don’t hesitate to call and ask to speak to one of the nurses for clinical concerns.  If you have a medical emergency, go to the nearest  emergency room or call 911.  A surgeon from [facility] is always on call at the hospital. ° °For further questions, please visit [email or web address]  ° ° ° ° ° °  Post Anesthesia Home Care Instructions ° °Activity: °Get plenty of rest for the remainder of the day. A responsible individual must stay with you for 24 hours following the procedure.  °For the next 24 hours, DO NOT: °-Drive a car °-Operate machinery °-Drink alcoholic beverages °-Take any medication unless instructed by your physician °-Make any legal decisions or sign important papers. ° °Meals: °Start with liquid foods such as gelatin or soup. Progress to regular foods as tolerated. Avoid greasy, spicy, heavy foods. If nausea and/or vomiting occur, drink only clear liquids until the nausea and/or vomiting subsides. Call your physician if vomiting continues. ° °Special Instructions/Symptoms: °Your throat may feel dry or sore from the anesthesia or the breathing tube placed in your throat during surgery. If this causes discomfort, gargle with warm salt water. The discomfort should disappear within 24 hours. ° °If you had a scopolamine patch placed behind your ear for the management of post- operative nausea and/or vomiting: ° °1. The medication in the patch is effective for 72 hours, after which it should be removed.  Wrap patch in a tissue and discard in the trash. Wash hands thoroughly with soap and water. °2. You may remove the patch earlier than 72 hours if you experience unpleasant side effects which may include dry mouth, dizziness or visual disturbances. °3. Avoid touching the patch. Wash your hands with soap and water after contact with the patch. °  °Call your surgeon if you experience:  ° °1.  Fever over 101.0. °2.  Inability to urinate. °3.  Nausea and/or vomiting. °4.  Extreme swelling or bruising at the surgical site. °5.  Continued bleeding from the incision. °6.  Increased pain, redness or drainage from the incision. °7.   Problems related to your pain medication. °8.  Any problems and/or concerns ° ° °

## 2017-03-03 NOTE — H&P (View-Only) (Signed)
Ebony Bartlett 02/18/2017 10:09 AM Location: [facility] Patient #: [id number] DOB: 1956/02/21 Undefined / Language: [place] / Race: White Female  History of Present Illness Ebony Fus Bartlett. Ebony Goldbach MD; 02/18/2017 11:10 AM) Patient words: Patient sent at the request of Dr. Larita Fife for right breast abnormal mammogram. The patient was found to have Bartlett mammographic abnormality. Ebony Bartlett core biopsy which showed Bartlett complex sclerosing lesion of right breast mass. Patient denies any history of significant breast pain, nipple discharge or mass. Her mother had breast cancer but there were no other first-degree relatives with breast cancer.               Diagnosis Breast, right, needle core biopsy, medial lower - COMPLEX SCLEROSING LESION. 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 - SEE MICROSCOPIC DESCRIPTION. Microscopic Comment There is Bartlett small complex sclerosing lesion with extensive fibrocystic changes. Called to [facility] on 01/10/17. (JDP:gt, 01/10/17) Ebony Picket MD Pathologist, Electronic Signature (Case signed 01/10/2017) Specimen Gross and Clinical Information Specimen Comment Arch distortion Specimen(s) Obtained: Breast, right, needle core biopsy, medial lower Specimen Clinical Information R/O cancer Gross Received in formalin labeled "Ebony Bartlett, right                 CLINICAL DATA: Six-month follow-up for probably benign right breast masses.  EXAM: 2D DIGITAL DIAGNOSTIC RIGHT MAMMOGRAM WITH CAD AND ADJUNCT TOMO  ULTRASOUND RIGHT BREAST  COMPARISON: 05/25/2016 and earlier  ACR Breast Density Category c: The breast tissue is heterogeneously dense, which may obscure small masses.  FINDINGS: Bartlett circumscribed mass is identified in the retroareolar region of the right breast, further evaluated sonographically. An area of possible distortion is identified in the slightly medial  aspect of the right breast, posterior depth.  Mammographic images were processed with CAD.  On physical exam, I palpate no abnormality in the central portion of the right breast.  Targeted ultrasound is performed, showing adjacent simple cysts in the 12 o'clock location of the right breast 1 cm from the nipple which measure 1.3 x 1.1 x 0.6 cm and 0.6 x 0.5 x 0.5 cm. Bartlett sonographic correlate for the area of distortion is not definitively identified. Evaluation of the axilla is negative for adenopathy.  IMPRESSION: 1. Simple cysts within the central portion of the right breast. 2. Area of persistent distortion in the slightly medial posterior aspect of the right breast for which tissue diagnosis is indicated. No sonographic correlate is identified for this finding. 3. No adenopathy by ultrasound.  RECOMMENDATION: Stereotactic guided core biopsy is recommended for area of distortion medial portion of the right breast.  I have discussed the findings and recommendations with the patient. Results were also provided in writing at the conclusion of the visit. If applicable, Bartlett reminder letter will be sent to the patient regarding the next appointment.  BI-RADS CATEGORY 4: Suspicious.   Electronically Signed By: Ebony Bartlett M.Bartlett. On: 12/24/2016 10:52.  The patient is Bartlett 61 year old female.   Past Surgical History (Ebony Cowley Bartlett. Tanasha Menees, MD; 02/18/2017 10:37 AM) Colon Polyp Removal - Colonoscopy Oral Surgery  Diagnostic Studies History (Ebony Lantry Bartlett. Niylah Hassan, MD; 02/18/2017 10:37 AM) Colonoscopy 1-5 years ago Mammogram within last year Pap Smear 1-5 years ago  Allergies Ebony Bartlett, RMA; 02/18/2017 10:10 AM) No Known Allergies 02/18/2017  Medication History Ebony Bartlett, RMA; 02/18/2017 10:11 AM) [name] (0.075MG /24HR Patch TW, Transdermal) Active. Progesterone (  Capsule, Oral) Active. ALPRAZolam (0.5MG  Tablet, Oral) Active. Medications  Reconciled  Social History (  Ebony Montuori Bartlett. Antoinette Haskett, MD; 02/18/2017 10:37 AM) Caffeine use Coffee, Tea. No alcohol use No drug use Tobacco use Never smoker.  Family History (Jamarrion Budai Bartlett. Imane Burrough, MD; 02/18/2017 10:37 AM) Breast Cancer Mother. Colon Polyps Mother. Hypertension Father, Mother.  Pregnancy / Birth History Ebony Fus Bartlett. Maryagnes Carrasco, MD; 02/18/2017 10:37 AM) Age at menarche 14 years. Age of menopause 51-55 Contraceptive History Oral contraceptives. Gravida 3 Irregular periods Length (months) of breastfeeding 7-12 Maternal age 67-25 Para 2  Other Problems (Ebony Wheller Bartlett. Teresa Lemmerman, MD; 02/18/2017 10:37 AM) No pertinent past medical history     Review of Systems (Ebony Lambright Bartlett. Kameren Baade MD; 02/18/2017 10:37 AM) General Not Present- Appetite Loss, Chills, Fatigue, Fever, Night Sweats, Weight Gain and Weight Loss. Skin Not Present- Change in Wart/Mole, Dryness, Hives, Jaundice, New Lesions, Non-Healing Wounds, Rash and Ulcer. HEENT Present- Wears glasses/contact lenses. Not Present- Earache, Hearing Loss, Hoarseness, Nose Bleed, Oral Ulcers, Ringing in the Ears, Seasonal Allergies, Sinus Pain, Sore Throat, Visual Disturbances and Yellow Eyes. Respiratory Not Present- Bloody sputum, Chronic Cough, Difficulty Breathing, Snoring and Wheezing. Breast Not Present- Breast Mass, Breast Pain, Nipple Discharge and Skin Changes. Cardiovascular Not Present- Chest Pain, Difficulty Breathing Lying Down, Leg Cramps, Palpitations, Rapid Heart Rate, Shortness of Breath and Swelling of Extremities. Gastrointestinal Not Present- Abdominal Pain, Bloating, Bloody Stool, Change in Bowel Habits, Chronic diarrhea, Constipation, Difficulty Swallowing, Excessive gas, Gets full quickly at meals, Hemorrhoids, Indigestion, Nausea, Rectal Pain and Vomiting. Female Genitourinary Not Present- Frequency, Nocturia, Painful Urination, Pelvic Pain and Urgency. Musculoskeletal Not Present- Back Pain, Joint Pain, Joint  Stiffness, Muscle Pain, Muscle Weakness and Swelling of Extremities. Neurological Not Present- Decreased Memory, Fainting, Headaches, Numbness, Seizures, Tingling, Tremor, Trouble walking and Weakness. Psychiatric Present- Change in Sleep Pattern. Not Present- Anxiety, Bipolar, Depression, Fearful and Frequent crying. Endocrine Present- Hot flashes. Not Present- Cold Intolerance, Excessive Hunger, Hair Changes, Heat Intolerance and New Diabetes. Hematology Not Present- Blood Thinners, Easy Bruising, Excessive bleeding, Gland problems, HIV and Persistent Infections.  Vitals Ebony Bartlett RMA; 02/18/2017 10:11 AM) 02/18/2017 10:11 AM Weight: 182.8 lb Height: 66in Body Surface Area: 1.93 m Body Mass Index: 29.5 kg/m  Temp.: 97.30F  Pulse: 72 (Regular)  BP: 150/100 (Sitting, Left Arm, Standard)      Physical Exam (Phares Zaccone Bartlett. Buel Molder MD; 02/18/2017 11:10 AM)  General Mental Status-Alert. General Appearance-Consistent with stated age. Hydration-Well hydrated. Voice-Normal.  Head and Neck Head-normocephalic, atraumatic with no lesions or palpable masses. Trachea-midline. Thyroid Gland Characteristics - normal size and consistency.  Chest and Lung Exam Chest and lung exam reveals -quiet, even and easy respiratory effort with no use of accessory muscles and on auscultation, normal breath sounds, no adventitious sounds and normal vocal resonance. Inspection Chest Wall - Normal. Back - normal.  Breast Breast - Left-Symmetric, Non Tender, No Biopsy scars, no Dimpling, No Inflammation, No Lumpectomy scars, No Mastectomy scars, No Peau Bartlett' Orange. Breast - Right-Symmetric, Non Tender, No Biopsy scars, no Dimpling, No Inflammation, No Lumpectomy scars, No Mastectomy scars, No Peau Bartlett' Orange. Breast Lump-No Palpable Breast Mass.  Cardiovascular Cardiovascular examination reveals -normal heart sounds, regular rate and rhythm with no murmurs and normal pedal  pulses bilaterally.  Lymphatic Head & Neck  General Head & Neck Lymphatics: Bilateral - Description - Normal. Axillary  General Axillary Region: Bilateral - Description - Normal. Tenderness - Non Tender.    Assessment & Plan (Saxton Chain Bartlett. Melessa Cowell MD; 02/18/2017 11:10 AM)  BREAST MASS, RIGHT (N63.10) Impression: Complex sclerosing lesion.  Risk pregnancy is 3% or less. Discussed  rationale for excision versus observation. Risks, benefits and alternatives to surgery discussed with the patient and husband today. Risk of lumpectomy include bleeding, infection, seroma, more surgery, use of seed/wire, wound care, cosmetic deformity and the need for other treatments, death , blood clots, death. Pt agrees to proceed.  Current Plans Pt Education - [facility] Breast Biopsy HCI: discussed with patient and provided information. You are being scheduled for surgery- Our schedulers will call you.  You should hear from our office's scheduling department within 5 working days about the location, date, and time of surgery. We try to make accommodations for patient's preferences in scheduling surgery, but sometimes the OR schedule or the surgeon's schedule prevents [place] from making those accommodations.  If you have not heard from our office [phone]) in 5 working days, call the office and ask for your surgeon's nurse.  If you have other questions about your diagnosis, plan, or surgery, call the office and ask for your surgeon's nurse.

## 2017-03-03 NOTE — Anesthesia Procedure Notes (Signed)
Procedure Name: LMA Insertion Date/Time: 03/03/2017 10:47 AM Performed by: Burna Cash Pre-anesthesia Checklist: Patient identified, Emergency Drugs available, Suction available and Patient being monitored Patient Re-evaluated:Patient Re-evaluated prior to induction Oxygen Delivery Method: Circle system utilized Preoxygenation: Pre-oxygenation with 100% oxygen Induction Type: IV induction Ventilation: Mask ventilation without difficulty LMA: LMA inserted LMA Size: 4.0 Number of attempts: 1 Airway Equipment and Method: Bite block Placement Confirmation: positive ETCO2 Tube secured with: Tape Dental Injury: Teeth and Oropharynx as per pre-operative assessment

## 2017-03-04 ENCOUNTER — Encounter (HOSPITAL_BASED_OUTPATIENT_CLINIC_OR_DEPARTMENT_OTHER): Payer: Self-pay | Admitting: Surgery

## 2018-10-13 IMAGING — MG MM PLC BREAST LOC DEV 1ST LESION INC*R*
5 series · 5 of 5 positions shown · non-contrast
Comparison: Previous exam(s).

CLINICAL DATA: Recently diagnosed complex sclerosing lesion in the
lower inner quadrant of the right breast.

EXAM:
MAMMOGRAPHIC GUIDED RADIOACTIVE SEED LOCALIZATION OF THE RIGHT
BREAST

[R CC (1 of 3)]
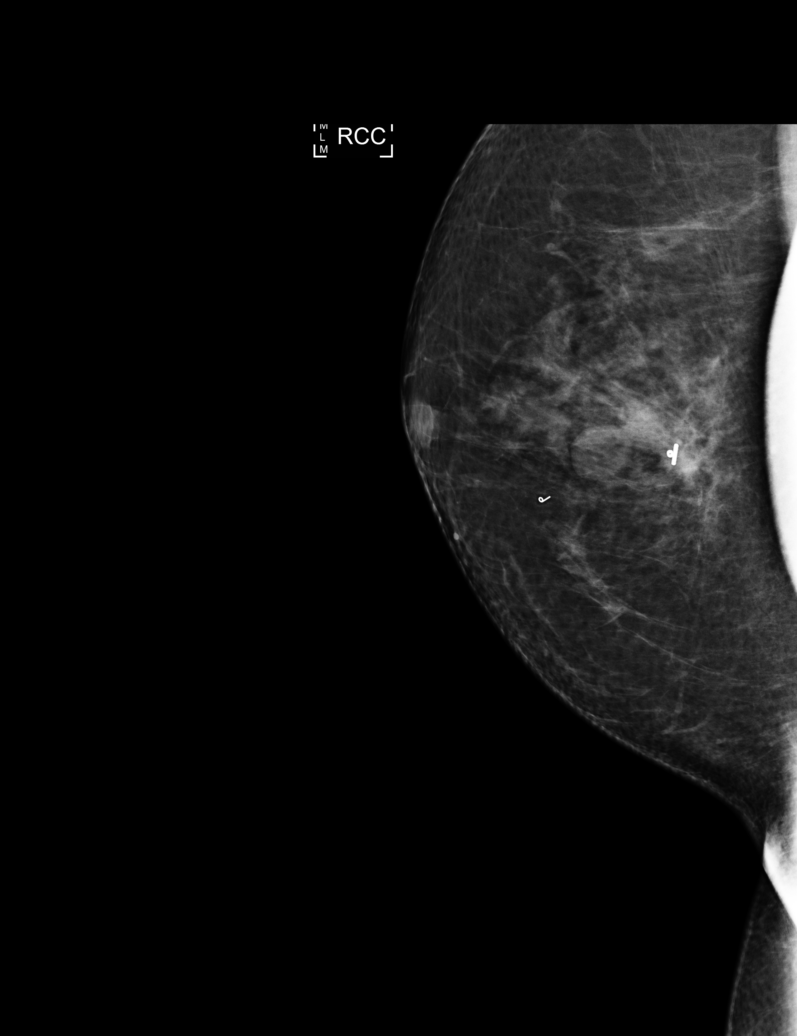

[R CC (2 of 3)]
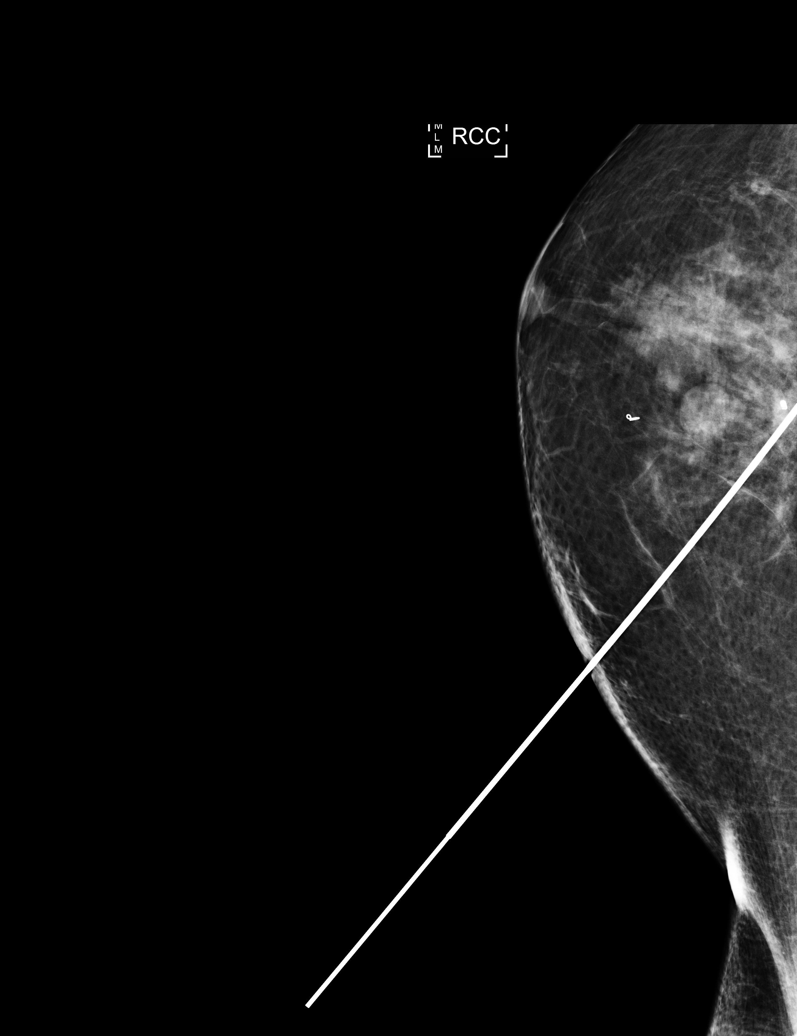

[R ML (1 of 2)]
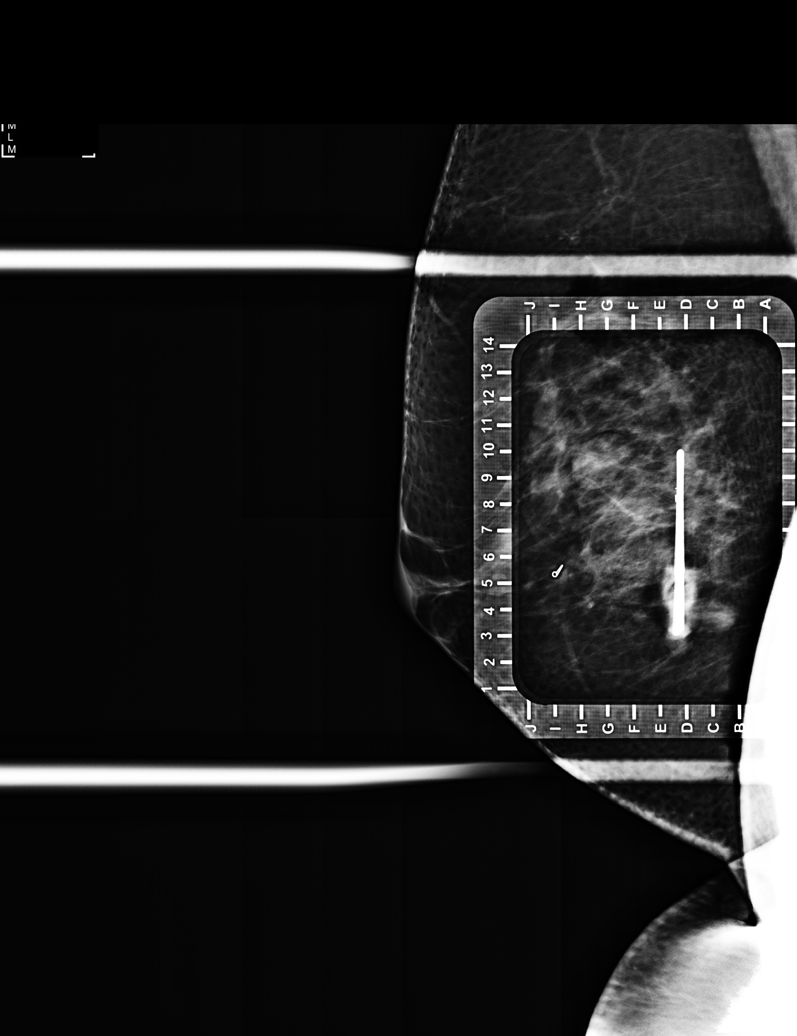

[R ML (2 of 2)]
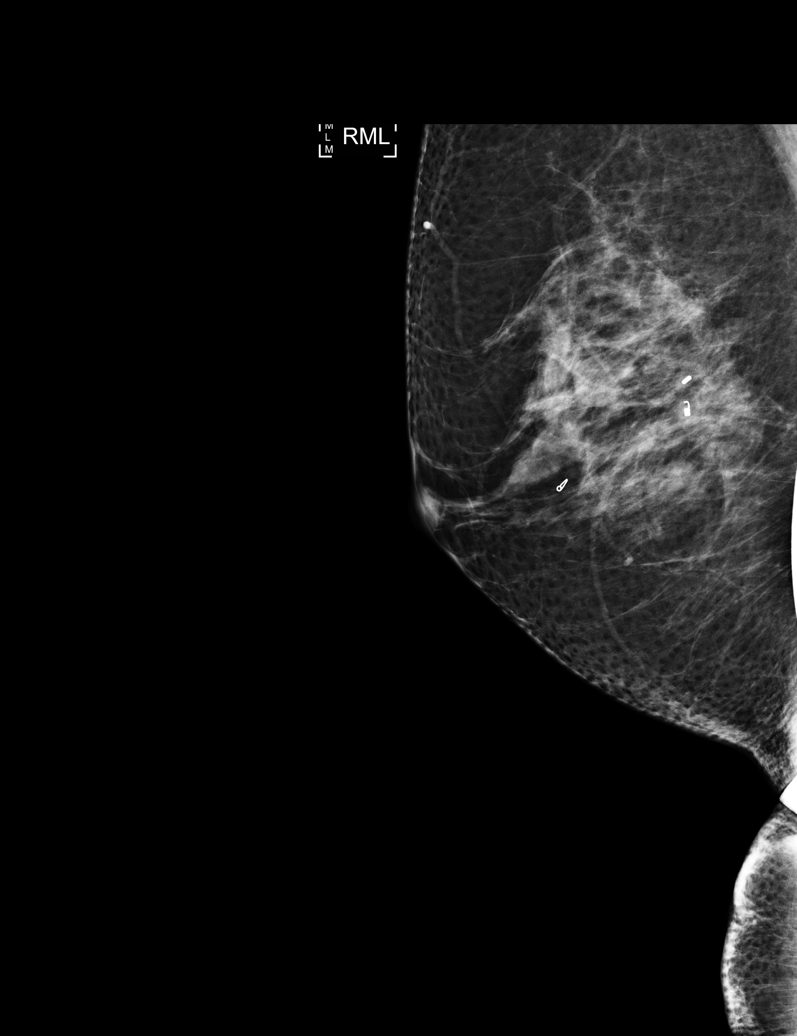

[R CC (3 of 3)]
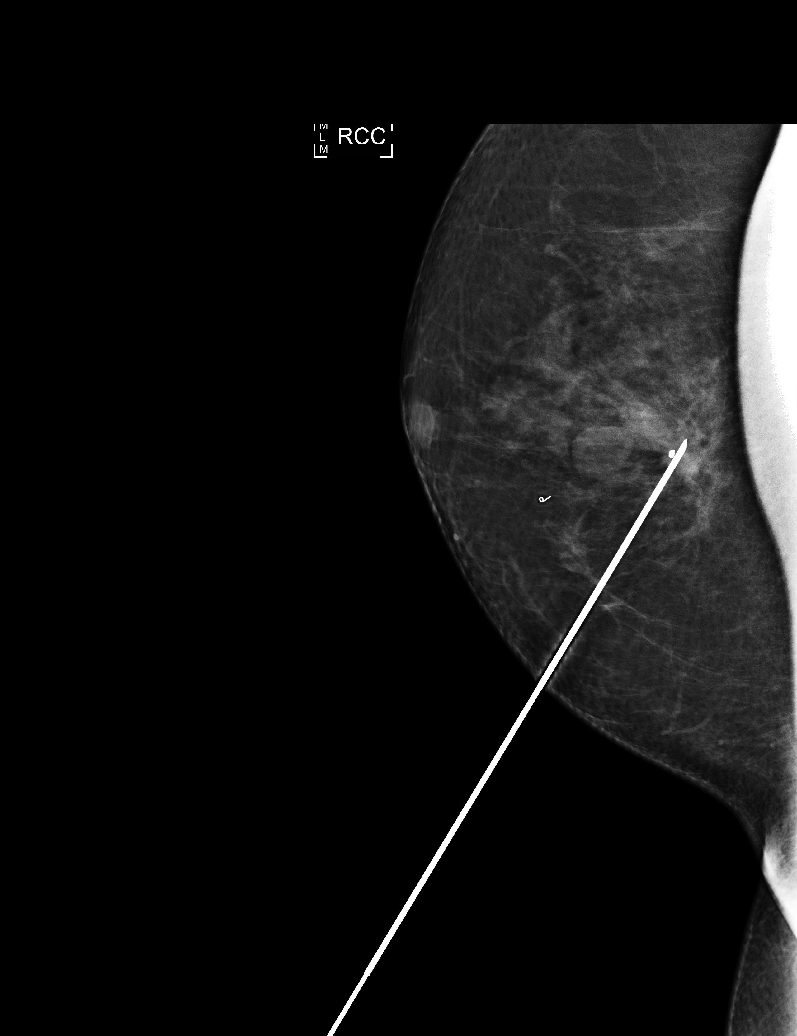

[5 of 5 positions shown; findings below may reference images not displayed]

FINDINGS: Patient presents for radioactive seed localization prior to right
breast surgical excision. I met with the patient and we discussed
the procedure of seed localization including benefits and
alternatives. We discussed the high likelihood of a successful
procedure. We discussed the risks of the procedure including
infection, bleeding, tissue injury and further surgery. We discussed
the low dose of radioactivity involved in the procedure. Informed,
written consent was given.

The usual time-out protocol was performed immediately prior to the
procedure.

Using mammographic guidance, sterile technique, 1% lidocaine and an
C-EXQ radioactive seed, the recently placed coil shaped biopsy
marker clip was localized using a medial approach. The follow-up
mammogram images confirm the seed in the expected location and were
marked for Dr. Pead.

Follow-up survey of the patient confirms presence of the radioactive
seed.

Order number of C-EXQ seed:  [id number].

Total activity:  0.252 millicurie  Reference Date: 02/18/2017

The patient tolerated the procedure well and was released from the
[REDACTED]. She was given instructions regarding seed removal.
IMPRESSION: Radioactive seed localization right breast. No apparent
complications.

## 2018-10-14 IMAGING — MG BREAST SURGICAL SPECIMEN
1 series · 1 of 1 positions shown · non-contrast
Comparison: Previous exam(s).

CLINICAL DATA: Patient status post right breast excisional biopsy.

EXAM:
SPECIMEN RADIOGRAPH OF THE RIGHT BREAST

[R]
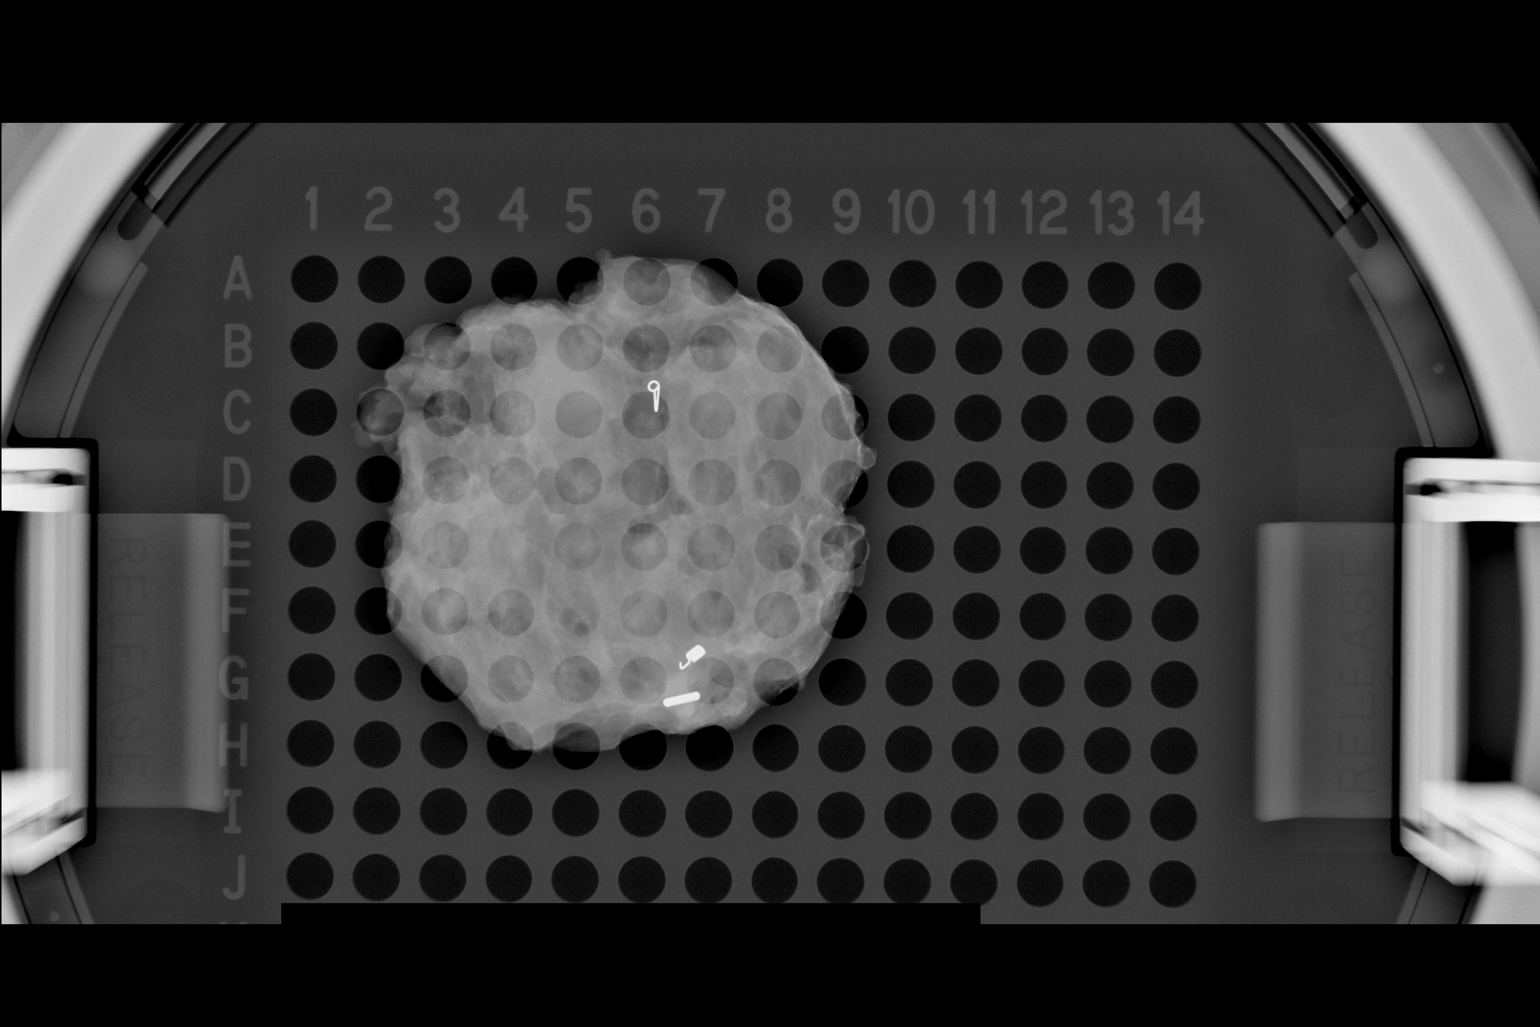

[1 of 1 positions shown; findings below may reference images not displayed]

FINDINGS: Status post excision of the right breast. The radioactive seed and
biopsy marker clips are present, completely intact, and were marked
for pathology.
IMPRESSION: Specimen radiograph of the right breast.

## 2021-01-20 ENCOUNTER — Other Ambulatory Visit: Payer: Self-pay | Admitting: Obstetrics & Gynecology

## 2021-01-20 DIAGNOSIS — R928 Other abnormal and inconclusive findings on diagnostic imaging of breast: Secondary | ICD-10-CM

## 2021-03-06 ENCOUNTER — Ambulatory Visit: Payer: BLUE CROSS/BLUE SHIELD

## 2021-03-06 ENCOUNTER — Ambulatory Visit
Admission: RE | Admit: 2021-03-06 | Discharge: 2021-03-06 | Disposition: A | Discharge status: Home / Self Care | Payer: BC Managed Care – PPO | Source: Ambulatory Visit | Attending: Obstetrics & Gynecology | Admitting: Obstetrics & Gynecology

## 2021-03-06 ENCOUNTER — Other Ambulatory Visit: Payer: Self-pay

## 2021-03-06 DIAGNOSIS — R928 Other abnormal and inconclusive findings on diagnostic imaging of breast: Secondary | ICD-10-CM

## 2022-04-27 ENCOUNTER — Other Ambulatory Visit: Payer: Self-pay

## 2022-04-27 DIAGNOSIS — R928 Other abnormal and inconclusive findings on diagnostic imaging of breast: Secondary | ICD-10-CM

## 2022-05-15 ENCOUNTER — Ambulatory Visit
Admission: RE | Admit: 2022-05-15 | Discharge: 2022-05-15 | Disposition: A | Discharge status: Home / Self Care | Payer: BC Managed Care – PPO | Source: Ambulatory Visit | Attending: Obstetrics & Gynecology | Admitting: Obstetrics & Gynecology

## 2022-05-15 ENCOUNTER — Ambulatory Visit: Payer: BC Managed Care – PPO

## 2022-05-15 DIAGNOSIS — R928 Other abnormal and inconclusive findings on diagnostic imaging of breast: Secondary | ICD-10-CM

## 2023-05-17 ENCOUNTER — Other Ambulatory Visit: Payer: Self-pay | Admitting: Obstetrics & Gynecology

## 2023-05-17 DIAGNOSIS — R928 Other abnormal and inconclusive findings on diagnostic imaging of breast: Secondary | ICD-10-CM

## 2023-06-10 ENCOUNTER — Ambulatory Visit
Admission: RE | Admit: 2023-06-10 | Discharge: 2023-06-10 | Disposition: A | Discharge status: Home / Self Care | Payer: Medicare HMO | Source: Ambulatory Visit | Attending: Obstetrics & Gynecology | Admitting: Obstetrics & Gynecology

## 2023-06-10 ENCOUNTER — Ambulatory Visit
Admission: RE | Admit: 2023-06-10 | Discharge: 2023-06-10 | Disposition: A | Discharge status: Home / Self Care | Payer: BC Managed Care – PPO | Source: Ambulatory Visit | Attending: Obstetrics & Gynecology | Admitting: Obstetrics & Gynecology

## 2023-06-10 DIAGNOSIS — R928 Other abnormal and inconclusive findings on diagnostic imaging of breast: Secondary | ICD-10-CM
